# Patient Record
Sex: Male | Born: 1950 | Hispanic: No | Marital: Married | Smoking: Former smoker
Health system: Southern US, Community
[De-identification: ages and names within clinical notes are randomized; demographics above are authoritative.]

## PROBLEM LIST (undated history)

## (undated) DIAGNOSIS — G4730 Sleep apnea, unspecified: Secondary | ICD-10-CM

## (undated) DIAGNOSIS — M1990 Unspecified osteoarthritis, unspecified site: Secondary | ICD-10-CM

## (undated) DIAGNOSIS — K219 Gastro-esophageal reflux disease without esophagitis: Secondary | ICD-10-CM

## (undated) HISTORY — PX: TONSILLECTOMY: SUR1361

---

## 2017-05-26 NOTE — Progress Notes (Signed)
Please place orders in EPIC as patient is being scheduled for a pre-op appointment! Thank you! 

## 2017-05-28 NOTE — Progress Notes (Signed)
Please place orders in EPIC as patient has a pre-op appointment on 06/05/2017! Thank you! 

## 2017-06-01 ENCOUNTER — Ambulatory Visit: Payer: Self-pay | Admitting: Specialist

## 2017-06-03 NOTE — Patient Instructions (Addendum)
Clayton OsierMarvin Kline  06/03/2017   Your procedure is scheduled on: 06-12-17  [facility]  Entrance [place] Elevators to 3rd floor to Short Stay Center at 5:30 AM.    Call this number if you have problems the morning of surgery [phone]    Remember: ONLY 1 PERSON MAY GO WITH YOU TO SHORT STAY TO GET  READY MORNING OF YOUR SURGERY.  Do not eat food or drink liquids :After Midnight.     Take these medicines the morning of surgery with A SIP OF WATER: None                               You may not have any metal on your body including hair pins and              piercings  Do not wear jewelry, lotions, powders, deodorant             Men may shave face and neck.   Do not bring valuables to the hospital. [place] IS NOT             RESPONSIBLE   FOR VALUABLES.  Contacts, dentures or bridgework may not be worn into surgery.  Leave suitcase in the car. After surgery it may be brought to your room.   PLEASE BRING YOUR MASK AND TUBING FOR YOUR CPAP MACHINE               Please read over the following fact sheets you were given: _____________________________________________________________________             [facility] Pre[facility] Before surgery, you can play an important role.  Because skin is not sterile, your skin needs to be as free of germs as possible.  You can reduce the number of germs on your skin by washing with CHG (chlorahexidine gluconate) soap before surgery.  CHG is an antiseptic cleaner which kills germs and bonds with the skin to continue killing germs even after washing. Please DO NOT use if you have an allergy to CHG or antibacterial soaps.  If your skin becomes reddened/irritated stop using the CHG and inform your nurse when you arrive at Short Stay. Do not shave (including legs and underarms) for at least 48 hours prior to the first CHG shower.  You may shave your face/neck. Please follow these instructions  carefully:  1.  Shower with CHG Soap the night before surgery and the  morning of Surgery.  2.  If you choose to wash your hair, wash your hair first as usual with your  normal  shampoo.  3.  After you shampoo, rinse your hair and body thoroughly to remove the  shampoo.                           4.  Use CHG as you would any other liquid soap.  You can apply chg directly  to the skin and wash                       Gently with a scrungie or clean washcloth.  5.  Apply the CHG Soap to your body ONLY FROM THE NECK DOWN.   Do not use on face/ open  Wound or open sores. Avoid contact with eyes, ears mouth and genitals (private parts).                       Wash face,  Genitals (private parts) with your normal soap.             6.  Wash thoroughly, paying special attention to the area where your surgery  will be performed.  7.  Thoroughly rinse your body with warm water from the neck down.  8.  DO NOT shower/wash with your normal soap after using and rinsing off  the CHG Soap.                9.  Pat yourself dry with a clean towel.            10.  Wear clean pajamas.            11.  Place clean sheets on your bed the night of your first shower and do not  sleep with pets. Day of Surgery : Do not apply any lotions/deodorants the morning of surgery.  Please wear clean clothes to the hospital/surgery center.  FAILURE TO FOLLOW THESE INSTRUCTIONS MAY RESULT IN THE CANCELLATION OF YOUR SURGERY PATIENT SIGNATURE_________________________________  NURSE SIGNATURE__________________________________  ________________________________________________________________________

## 2017-06-03 NOTE — Progress Notes (Signed)
05-19-17 Surgical clearance from Caryl ComesBriejante Williams, PAC on chart.

## 2017-06-05 ENCOUNTER — Ambulatory Visit (HOSPITAL_COMMUNITY)
Admission: RE | Admit: 2017-06-05 | Discharge: 2017-06-05 | Disposition: A | Discharge status: Home / Self Care | Payer: BLUE CROSS/BLUE SHIELD | Source: Ambulatory Visit | Attending: Specialist | Admitting: Specialist

## 2017-06-05 ENCOUNTER — Encounter (HOSPITAL_COMMUNITY): Payer: Self-pay

## 2017-06-05 ENCOUNTER — Encounter (HOSPITAL_COMMUNITY)
Admission: RE | Admit: 2017-06-05 | Discharge: 2017-06-05 | Disposition: A | Discharge status: Home / Self Care | Payer: BLUE CROSS/BLUE SHIELD | Source: Ambulatory Visit | Attending: Specialist | Admitting: Specialist

## 2017-06-05 ENCOUNTER — Encounter (INDEPENDENT_AMBULATORY_CARE_PROVIDER_SITE_OTHER): Payer: Self-pay

## 2017-06-05 DIAGNOSIS — Z01818 Encounter for other preprocedural examination: Secondary | ICD-10-CM | POA: Diagnosis not present

## 2017-06-05 DIAGNOSIS — M5126 Other intervertebral disc displacement, lumbar region: Secondary | ICD-10-CM | POA: Insufficient documentation

## 2017-06-05 DIAGNOSIS — Z01812 Encounter for preprocedural laboratory examination: Secondary | ICD-10-CM | POA: Diagnosis present

## 2017-06-05 DIAGNOSIS — M4316 Spondylolisthesis, lumbar region: Secondary | ICD-10-CM | POA: Insufficient documentation

## 2017-06-05 HISTORY — DX: Unspecified osteoarthritis, unspecified site: M19.90

## 2017-06-05 HISTORY — DX: Gastro-esophageal reflux disease without esophagitis: K21.9

## 2017-06-05 HISTORY — DX: Sleep apnea, unspecified: G47.30

## 2017-06-05 LAB — BASIC METABOLIC PANEL
Anion gap: 11 (ref 5–15)
BUN: 16 mg/dL (ref 6–20)
CHLORIDE: 102 mmol/L (ref 101–111)
CO2: 25 mmol/L (ref 22–32)
CREATININE: 1.01 mg/dL (ref 0.61–1.24)
Calcium: 9.6 mg/dL (ref 8.9–10.3)
GFR calc Af Amer: >60 mL/min (ref >60)
GFR calc non Af Amer: >60 mL/min (ref >60)
Glucose, Bld: 108 mg/dL — ABNORMAL HIGH (ref 65–99)
Potassium: 4.4 mmol/L (ref 3.5–5.1)
Sodium: 138 mmol/L (ref 135–145)

## 2017-06-05 LAB — CBC
HCT: 43.1 % (ref 39.0–52.0)
Hemoglobin: 15.3 g/dL (ref 13.0–17.0)
MCH: 30.5 pg (ref 26.0–34.0)
MCHC: 35.5 g/dL (ref 30.0–36.0)
MCV: 86 fL (ref 78.0–100.0)
PLATELETS: 242 10*3/uL (ref 150–400)
RBC: 5.01 MIL/uL (ref 4.22–5.81)
RDW: 12.9 % (ref 11.5–15.5)
WBC: 5.9 10*3/uL (ref 4.0–10.5)

## 2017-06-05 LAB — SURGICAL PCR SCREEN
MRSA, PCR: NEGATIVE
Staphylococcus aureus: NEGATIVE

## 2017-06-05 NOTE — Progress Notes (Signed)
06-05-17 [facility] PAC indicated that pt needed to discuss pending surgery with  Sleep Study physician. Pt verified that he did discuss pending surgery with sleep study provider. Provider advised pt to bring mask and tubing for procedure, which patient plans to do.  Pt also wants nursing staff to be aware of recent shoulder dislocation, (2 1/2 months ago). Pt stated that surgeon is aware, and that area was treated and is healing appropriately. However, he wants nsuring staff to be made aware, so that they will be  'gentle'  with transfers.

## 2017-06-08 ENCOUNTER — Ambulatory Visit: Payer: Self-pay | Admitting: Orthopedic Surgery

## 2017-06-08 NOTE — H&P (Signed)
Clayton Kline is an 66 y.o. male.   Chief Complaint: back and leg pain HPI: The patient is a 66 year old male who presents for a Follow-up for Follow-up back. The patient is being followed for their back pain. They are now year(s) out from when symptoms began. Symptoms reported today include: pain and pain with standing. Current treatment includes: activity modification. The following medication has been used for pain control: none. The patient has reported improvement of their symptoms with: Cortisone injections. The patient indicates that they have questions or concerns today regarding surgery.  Eduardo Osier follows up and he is reporting still bilateral buttock pain, worse with activity, better with rest. He would like to discuss surgery. He is going to retire, go down to [facility].  He reports no back pain, mainly buttock pain.  Review of systems is negative for fevers, chest pain, shortness of breath, unexplained recent weight loss, loss of bowel or bladder function, burning with urination, joint swelling, rashes, weakness or numbness, difficulty with balance, easy bruising, excessive thirst or frequent urination.  Past Medical History:  Diagnosis Date  . Arthritis   . GERD (gastroesophageal reflux disease)   . Sleep apnea     Past Surgical History:  Procedure Laterality Date  . TONSILLECTOMY      No family history on file. Social History:  reports that he quit smoking about 35 years ago. He has never used smokeless tobacco. He reports that he drinks alcohol. He reports that he does not use drugs.  Allergies:  Allergies  Allergen Reactions  . Norco [Hydrocodone-Acetaminophen] Other (See Comments)    Can't concentrate very well     (Not in a hospital admission)  No results found for this or any previous visit (from the past 48 hour(s)). No results found.  Review of Systems  Constitutional: Negative.   HENT: Negative.   Eyes: Negative.   Respiratory: Negative.    Cardiovascular: Negative.   Gastrointestinal: Negative.   Genitourinary: Negative.   Musculoskeletal: Positive for back pain.  Skin: Negative.   Neurological: Positive for sensory change and focal weakness.  Psychiatric/Behavioral: Negative.     There were no vitals taken for this visit. Physical Exam  Constitutional: He is oriented to person, place, and time. He appears well-developed.  HENT:  Head: Normocephalic.  Eyes: Pupils are equal, round, and reactive to light.  Neck: Normal range of motion.  Cardiovascular: Normal rate.   Respiratory: Effort normal.  GI: Soft.  Musculoskeletal:  On exam, healthy, mild discomfort. Straight leg raises buttock pain bilaterally. Trace EHL bilaterally. Pain relieved with forward flexion. Radicular pain with extension.  Neurological: He is alert and oriented to person, place, and time.  Skin: Skin is warm and dry.    AP, lateral and flexion and extension demonstrates minimal offset at 4-5, but no instability in flexion and extension.  MRI demonstrates multifactorial severe stenosis at 4-5, slight offset is noted there.  Some hypertrophic ligamentum flavum is noted. The stenosis is predominantly beneath the inferior aspect of the neural arch at 4. There is some foraminal narrowing at L4 foramen, more on the right than on the left.  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 Again, no instability in flexion and extension. He has no motion at 4-5.  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 No instability in flexion and extension. No change from supine to the upright position. Myotomal weakness and dermal dysesthesias, also has facet arthropathy  at 3-4 and at 5-1.  1. We had an extensive discussion concerning current pathology, relevant anatomy and treatment options. Options living with his symptoms as they are now, avoiding extension and favoring flexion. We discussed  this. 2. I would get epidurals as well. 3. We discussed surgical intervention and decompression at 4-5. Given he is not moving and he does not have back pain, we could do a limited decompression without concomitant fusion, though I did indicate he may require a fusion in the future. He understands. 4. Bilateral decompression would be appropriate as well. There may be a pars defect at L5 on the left. He has facet arthropathy so any instrumentation at 4-5 would likely aggravate the facets at 3-4 and at 5-1. We will have him scheduled for that procedure. He is not allergic to penicillin. No history of DVT or MRSA. He is on a blood pressure medicine. He again predominately has it down in the left leg with standing and relieved with sitting, symptoms for four years.  Plan bilateral microlumbar decompression L4-5, possible central laminectomy  Dorothy SparkBISSELL, Jamilah Jean M., PA-C for Dr. Shelle IronBeane 06/08/2017, 12:14 PM

## 2017-06-11 ENCOUNTER — Encounter (HOSPITAL_COMMUNITY): Payer: Self-pay | Admitting: Anesthesiology

## 2017-06-11 NOTE — Anesthesia Preprocedure Evaluation (Addendum)
Anesthesia Evaluation  Patient identified by MRN, date of birth, ID band Patient awake    Reviewed: Allergy & Precautions, NPO status , Patient's Chart, lab work & pertinent test results  Airway Mallampati: I       Dental no notable dental hx. (+) Teeth Intact   Pulmonary former smoker,    Pulmonary exam normal breath sounds clear to auscultation       Cardiovascular Normal cardiovascular exam Rhythm:Regular Rate:Normal     Neuro/Psych negative neurological ROS  negative psych ROS   GI/Hepatic   Endo/Other  negative endocrine ROS  Renal/GU negative Renal ROS  negative genitourinary   Musculoskeletal   Abdominal Normal abdominal exam  (+)   Peds  Hematology negative hematology ROS (+)   Anesthesia Other Findings   Reproductive/Obstetrics                            Anesthesia Physical Anesthesia Plan  ASA: II  Anesthesia Plan: General   Post-op Pain Management:    Induction: Intravenous  PONV Risk Score and Plan: 3 and Ondansetron, Dexamethasone, Propofol and Midazolam  Airway Management Planned: Oral ETT  Additional Equipment:   Intra-op Plan:   Post-operative Plan: Extubation in OR  Informed Consent: I have reviewed the patients History and Physical, chart, labs and discussed the procedure including the risks, benefits and alternatives for the proposed anesthesia with the patient or authorized representative who has indicated his/her understanding and acceptance.   Dental advisory given  Plan Discussed with: CRNA and Surgeon  Anesthesia Plan Comments:        Anesthesia Quick Evaluation

## 2017-06-12 ENCOUNTER — Ambulatory Visit (HOSPITAL_COMMUNITY): Payer: BLUE CROSS/BLUE SHIELD

## 2017-06-12 ENCOUNTER — Encounter (HOSPITAL_COMMUNITY): Payer: Self-pay | Admitting: *Deleted

## 2017-06-12 ENCOUNTER — Ambulatory Visit (HOSPITAL_COMMUNITY): Payer: BLUE CROSS/BLUE SHIELD | Admitting: Certified Registered Nurse Anesthetist

## 2017-06-12 ENCOUNTER — Ambulatory Visit (HOSPITAL_COMMUNITY)
Admission: RE | Admit: 2017-06-12 | Discharge: 2017-06-13 | Disposition: A | Discharge status: Home / Self Care | Payer: BLUE CROSS/BLUE SHIELD | Source: Ambulatory Visit | Attending: Specialist | Admitting: Specialist

## 2017-06-12 ENCOUNTER — Encounter (HOSPITAL_COMMUNITY)
Admission: RE | Disposition: A | Discharge status: Home / Self Care | Payer: Self-pay | Source: Ambulatory Visit | Attending: Specialist

## 2017-06-12 DIAGNOSIS — M48061 Spinal stenosis, lumbar region without neurogenic claudication: Secondary | ICD-10-CM | POA: Diagnosis present

## 2017-06-12 DIAGNOSIS — Z885 Allergy status to narcotic agent status: Secondary | ICD-10-CM | POA: Insufficient documentation

## 2017-06-12 DIAGNOSIS — Z87891 Personal history of nicotine dependence: Secondary | ICD-10-CM | POA: Insufficient documentation

## 2017-06-12 DIAGNOSIS — G4730 Sleep apnea, unspecified: Secondary | ICD-10-CM | POA: Diagnosis not present

## 2017-06-12 DIAGNOSIS — M5116 Intervertebral disc disorders with radiculopathy, lumbar region: Secondary | ICD-10-CM | POA: Insufficient documentation

## 2017-06-12 DIAGNOSIS — M48062 Spinal stenosis, lumbar region with neurogenic claudication: Secondary | ICD-10-CM | POA: Diagnosis not present

## 2017-06-12 DIAGNOSIS — M1990 Unspecified osteoarthritis, unspecified site: Secondary | ICD-10-CM | POA: Diagnosis not present

## 2017-06-12 DIAGNOSIS — K219 Gastro-esophageal reflux disease without esophagitis: Secondary | ICD-10-CM | POA: Diagnosis not present

## 2017-06-12 DIAGNOSIS — M160 Bilateral primary osteoarthritis of hip: Secondary | ICD-10-CM | POA: Diagnosis not present

## 2017-06-12 DIAGNOSIS — Z419 Encounter for procedure for purposes other than remedying health state, unspecified: Secondary | ICD-10-CM

## 2017-06-12 HISTORY — PX: LUMBAR LAMINECTOMY/DECOMPRESSION MICRODISCECTOMY: SHX5026

## 2017-06-12 SURGERY — LUMBAR LAMINECTOMY/DECOMPRESSION MICRODISCECTOMY
Anesthesia: General | Site: Back | Laterality: Bilateral

## 2017-06-12 MED ORDER — DEXAMETHASONE SODIUM PHOSPHATE 10 MG/ML IJ SOLN
INTRAMUSCULAR | Status: DC | PRN
  Administered 2017-06-12: 10 mg via INTRAVENOUS

## 2017-06-12 MED ORDER — FENTANYL CITRATE (PF) 100 MCG/2ML IJ SOLN
INTRAMUSCULAR | Status: DC | PRN
  Administered 2017-06-12 (×3): 50 ug via INTRAVENOUS

## 2017-06-12 MED ORDER — GELATIN ABSORBABLE MT POWD
OROMUCOSAL | Status: DC | PRN
  Administered 2017-06-12: 5 mL via TOPICAL

## 2017-06-12 MED ORDER — SUCCINYLCHOLINE CHLORIDE 200 MG/10ML IV SOSY
PREFILLED_SYRINGE | INTRAVENOUS | Status: AC
  Filled 2017-06-12: qty 10

## 2017-06-12 MED ORDER — DOCUSATE SODIUM 100 MG PO CAPS: 100.0000 mg | ORAL_CAPSULE | Freq: Two times a day (BID) | ORAL | 1 refills | Status: AC | PRN

## 2017-06-12 MED ORDER — PHENOL 1.4 % MT LIQD
1.0000 | OROMUCOSAL | Status: DC | PRN
  Filled 2017-06-12: qty 177

## 2017-06-12 MED ORDER — PROPOFOL 10 MG/ML IV BOLUS
INTRAVENOUS | Status: AC
  Filled 2017-06-12: qty 20

## 2017-06-12 MED ORDER — CHLORHEXIDINE GLUCONATE 4 % EX LIQD: 60.0000 mL | Freq: Once | CUTANEOUS | Status: DC

## 2017-06-12 MED ORDER — CEFAZOLIN SODIUM-DEXTROSE 2-4 GM/100ML-% IV SOLN
INTRAVENOUS | Status: AC
  Filled 2017-06-12: qty 100

## 2017-06-12 MED ORDER — MIDAZOLAM HCL 5 MG/5ML IJ SOLN
INTRAMUSCULAR | Status: DC | PRN
  Administered 2017-06-12: 2 mg via INTRAVENOUS

## 2017-06-12 MED ORDER — BUPIVACAINE-EPINEPHRINE (PF) 0.5% -1:200000 IJ SOLN
INTRAMUSCULAR | Status: DC | PRN
  Administered 2017-06-12: 16 mL

## 2017-06-12 MED ORDER — FENTANYL CITRATE (PF) 250 MCG/5ML IJ SOLN
INTRAMUSCULAR | Status: AC
  Filled 2017-06-12: qty 5

## 2017-06-12 MED ORDER — ACETAMINOPHEN 10 MG/ML IV SOLN
INTRAVENOUS | Status: AC
  Filled 2017-06-12: qty 100

## 2017-06-12 MED ORDER — DOCUSATE SODIUM 100 MG PO CAPS
100.0000 mg | ORAL_CAPSULE | Freq: Two times a day (BID) | ORAL | Status: DC
  Administered 2017-06-12 – 2017-06-13 (×2): 100 mg via ORAL
  Filled 2017-06-12 (×2): qty 1

## 2017-06-12 MED ORDER — DEXAMETHASONE SODIUM PHOSPHATE 10 MG/ML IJ SOLN
INTRAMUSCULAR | Status: AC
  Filled 2017-06-12: qty 1

## 2017-06-12 MED ORDER — CEFAZOLIN SODIUM-DEXTROSE 2-4 GM/100ML-% IV SOLN
2.0000 g | Freq: Three times a day (TID) | INTRAVENOUS | Status: AC
  Administered 2017-06-12 – 2017-06-13 (×2): 2 g via INTRAVENOUS
  Filled 2017-06-12 (×2): qty 100

## 2017-06-12 MED ORDER — METHOCARBAMOL 1000 MG/10ML IJ SOLN
500.0000 mg | Freq: Four times a day (QID) | INTRAVENOUS | Status: DC | PRN
  Administered 2017-06-12: 500 mg via INTRAVENOUS
  Filled 2017-06-12: qty 550

## 2017-06-12 MED ORDER — SODIUM CHLORIDE 0.9 % IV SOLN
INTRAVENOUS | Status: AC
  Filled 2017-06-12: qty 500000

## 2017-06-12 MED ORDER — PHENYLEPHRINE HCL 10 MG/ML IJ SOLN
INTRAMUSCULAR | Status: AC
  Filled 2017-06-12: qty 1

## 2017-06-12 MED ORDER — SUCCINYLCHOLINE CHLORIDE 200 MG/10ML IV SOSY
PREFILLED_SYRINGE | INTRAVENOUS | Status: DC | PRN
  Administered 2017-06-12: 120 mg via INTRAVENOUS

## 2017-06-12 MED ORDER — MEPERIDINE HCL 50 MG/ML IJ SOLN: 6.2500 mg | INTRAMUSCULAR | Status: DC | PRN

## 2017-06-12 MED ORDER — PHENYLEPHRINE HCL 10 MG/ML IJ SOLN
INTRAVENOUS | Status: DC | PRN
  Administered 2017-06-12: 25 ug/min via INTRAVENOUS

## 2017-06-12 MED ORDER — ACETAMINOPHEN 325 MG PO TABS: 650.0000 mg | ORAL_TABLET | ORAL | Status: DC | PRN

## 2017-06-12 MED ORDER — OXYCODONE-ACETAMINOPHEN 5-325 MG PO TABS: 1.0000 | ORAL_TABLET | ORAL | 0 refills | Status: AC | PRN

## 2017-06-12 MED ORDER — ROCURONIUM BROMIDE 50 MG/5ML IV SOSY
PREFILLED_SYRINGE | INTRAVENOUS | Status: AC
  Filled 2017-06-12: qty 5

## 2017-06-12 MED ORDER — CEFAZOLIN SODIUM-DEXTROSE 2-4 GM/100ML-% IV SOLN
2.0000 g | INTRAVENOUS | Status: AC
  Administered 2017-06-12: 2 g via INTRAVENOUS

## 2017-06-12 MED ORDER — BUPIVACAINE-EPINEPHRINE (PF) 0.5% -1:200000 IJ SOLN
INTRAMUSCULAR | Status: AC
  Filled 2017-06-12: qty 30

## 2017-06-12 MED ORDER — PROMETHAZINE HCL 25 MG/ML IJ SOLN: 6.2500 mg | INTRAMUSCULAR | Status: DC | PRN

## 2017-06-12 MED ORDER — ACETAMINOPHEN 650 MG RE SUPP
650.0000 mg | RECTAL | Status: DC | PRN
Start: 2017-06-12 — End: 2017-06-13

## 2017-06-12 MED ORDER — ACETAMINOPHEN 10 MG/ML IV SOLN
1000.0000 mg | Freq: Four times a day (QID) | INTRAVENOUS | Status: DC
  Administered 2017-06-12: 1000 mg via INTRAVENOUS

## 2017-06-12 MED ORDER — OXYCODONE HCL 5 MG PO TABS
5.0000 mg | ORAL_TABLET | ORAL | Status: DC | PRN
  Administered 2017-06-12 – 2017-06-13 (×4): 10 mg via ORAL
  Filled 2017-06-12 (×4): qty 2

## 2017-06-12 MED ORDER — SODIUM CHLORIDE 0.9 % IV SOLN
INTRAVENOUS | Status: DC | PRN
  Administered 2017-06-12: 500 mL

## 2017-06-12 MED ORDER — POLYETHYLENE GLYCOL 3350 17 G PO PACK: 17.0000 g | PACK | Freq: Every day | ORAL | 0 refills | Status: AC

## 2017-06-12 MED ORDER — ONDANSETRON HCL 4 MG/2ML IJ SOLN: 4.0000 mg | Freq: Four times a day (QID) | INTRAMUSCULAR | Status: DC | PRN

## 2017-06-12 MED ORDER — THROMBIN 5000 UNITS EX SOLR
OROMUCOSAL | Status: DC | PRN
  Administered 2017-06-12: 5 mL via TOPICAL

## 2017-06-12 MED ORDER — FAMOTIDINE 20 MG PO TABS
10.0000 mg | ORAL_TABLET | Freq: Two times a day (BID) | ORAL | Status: DC
  Administered 2017-06-12 – 2017-06-13 (×3): 10 mg via ORAL
  Filled 2017-06-12 (×3): qty 1

## 2017-06-12 MED ORDER — MAGNESIUM CITRATE PO SOLN: 1.0000 | Freq: Once | ORAL | Status: DC | PRN

## 2017-06-12 MED ORDER — ONDANSETRON HCL 4 MG/2ML IJ SOLN
INTRAMUSCULAR | Status: DC | PRN
  Administered 2017-06-12: 4 mg via INTRAVENOUS

## 2017-06-12 MED ORDER — ROCURONIUM BROMIDE 50 MG/5ML IV SOSY
PREFILLED_SYRINGE | INTRAVENOUS | Status: DC | PRN
  Administered 2017-06-12 (×2): 5 mg via INTRAVENOUS
  Administered 2017-06-12: 40 mg via INTRAVENOUS
  Administered 2017-06-12 (×2): 5 mg via INTRAVENOUS

## 2017-06-12 MED ORDER — LIDOCAINE 2% (20 MG/ML) 5 ML SYRINGE
INTRAMUSCULAR | Status: DC | PRN
  Administered 2017-06-12: 100 mg via INTRAVENOUS

## 2017-06-12 MED ORDER — FENTANYL CITRATE (PF) 100 MCG/2ML IJ SOLN: 25.0000 ug | INTRAMUSCULAR | Status: DC | PRN

## 2017-06-12 MED ORDER — PHENYLEPHRINE HCL 10 MG/ML IJ SOLN
INTRAMUSCULAR | Status: DC | PRN
  Administered 2017-06-12: 80 ug via INTRAVENOUS
  Administered 2017-06-12: 40 ug via INTRAVENOUS
  Administered 2017-06-12: 120 ug via INTRAVENOUS
  Administered 2017-06-12 (×2): 80 ug via INTRAVENOUS

## 2017-06-12 MED ORDER — THROMBIN 5000 UNITS EX SOLR
CUTANEOUS | Status: AC
  Filled 2017-06-12: qty 10000

## 2017-06-12 MED ORDER — EPHEDRINE SULFATE 50 MG/ML IJ SOLN
INTRAMUSCULAR | Status: DC | PRN
  Administered 2017-06-12: 10 mg via INTRAVENOUS

## 2017-06-12 MED ORDER — METHOCARBAMOL 500 MG PO TABS
500.0000 mg | ORAL_TABLET | Freq: Four times a day (QID) | ORAL | Status: DC | PRN
  Administered 2017-06-13: 02:00:00 500 mg via ORAL
  Filled 2017-06-12: qty 1

## 2017-06-12 MED ORDER — LACTATED RINGERS IV SOLN
INTRAVENOUS | Status: DC
  Administered 2017-06-12 (×2): via INTRAVENOUS

## 2017-06-12 MED ORDER — MENTHOL 3 MG MT LOZG: 1.0000 | LOZENGE | OROMUCOSAL | Status: DC | PRN

## 2017-06-12 MED ORDER — ONDANSETRON HCL 4 MG/2ML IJ SOLN
INTRAMUSCULAR | Status: AC
  Filled 2017-06-12: qty 2

## 2017-06-12 MED ORDER — LIDOCAINE 2% (20 MG/ML) 5 ML SYRINGE
INTRAMUSCULAR | Status: AC
  Filled 2017-06-12: qty 5

## 2017-06-12 MED ORDER — PROPOFOL 10 MG/ML IV BOLUS
INTRAVENOUS | Status: DC | PRN
  Administered 2017-06-12: 200 mg via INTRAVENOUS

## 2017-06-12 MED ORDER — SUGAMMADEX SODIUM 200 MG/2ML IV SOLN
INTRAVENOUS | Status: DC | PRN
  Administered 2017-06-12: 200 mg via INTRAVENOUS

## 2017-06-12 MED ORDER — FENTANYL CITRATE (PF) 100 MCG/2ML IJ SOLN
INTRAMUSCULAR | Status: AC
  Filled 2017-06-12: qty 2

## 2017-06-12 MED ORDER — LORATADINE 10 MG PO TABS
10.0000 mg | ORAL_TABLET | Freq: Every day | ORAL | Status: DC
  Administered 2017-06-12 – 2017-06-13 (×2): 10 mg via ORAL
  Filled 2017-06-12 (×2): qty 1

## 2017-06-12 MED ORDER — RISAQUAD PO CAPS
1.0000 | ORAL_CAPSULE | Freq: Every day | ORAL | Status: DC
  Administered 2017-06-12 – 2017-06-13 (×2): 1 via ORAL
  Filled 2017-06-12 (×2): qty 1

## 2017-06-12 MED ORDER — BISACODYL 5 MG PO TBEC: 5.0000 mg | DELAYED_RELEASE_TABLET | Freq: Every day | ORAL | Status: DC | PRN

## 2017-06-12 MED ORDER — KETOROLAC TROMETHAMINE 30 MG/ML IJ SOLN: 30.0000 mg | Freq: Once | INTRAMUSCULAR | Status: DC | PRN

## 2017-06-12 MED ORDER — POLYETHYLENE GLYCOL 3350 17 G PO PACK: 17.0000 g | PACK | Freq: Every day | ORAL | Status: DC | PRN

## 2017-06-12 MED ORDER — MIDAZOLAM HCL 2 MG/2ML IJ SOLN
INTRAMUSCULAR | Status: AC
  Filled 2017-06-12: qty 2

## 2017-06-12 MED ORDER — ALUM & MAG HYDROXIDE-SIMETH 200-200-20 MG/5ML PO SUSP: 30.0000 mL | Freq: Four times a day (QID) | ORAL | Status: DC | PRN

## 2017-06-12 MED ORDER — KCL IN DEXTROSE-NACL 20-5-0.45 MEQ/L-%-% IV SOLN
INTRAVENOUS | Status: AC
  Administered 2017-06-12: 15:00:00 via INTRAVENOUS
  Filled 2017-06-12 (×2): qty 1000

## 2017-06-12 MED ORDER — DIPHENHYDRAMINE HCL 25 MG PO CAPS: 50.0000 mg | ORAL_CAPSULE | Freq: Four times a day (QID) | ORAL | Status: DC | PRN

## 2017-06-12 MED ORDER — ONDANSETRON HCL 4 MG PO TABS: 4.0000 mg | ORAL_TABLET | Freq: Four times a day (QID) | ORAL | Status: DC | PRN

## 2017-06-12 SURGICAL SUPPLY — 46 items
BAG ZIPLOCK 12X15 (MISCELLANEOUS) ×3 IMPLANT
CLOSURE WOUND 1/2 X4 (GAUZE/BANDAGES/DRESSINGS) ×1
CLOTH 2% CHLOROHEXIDINE 3PK (PERSONAL CARE ITEMS) ×3 IMPLANT
COVER SURGICAL LIGHT HANDLE (MISCELLANEOUS) ×3 IMPLANT
DRAPE MICROSCOPE LEICA (MISCELLANEOUS) ×3 IMPLANT
DRAPE SHEET LG 3/4 BI-LAMINATE (DRAPES) ×3 IMPLANT
DRAPE SURG 17X11 SM STRL (DRAPES) ×3 IMPLANT
DRAPE UTILITY XL STRL (DRAPES) ×3 IMPLANT
DRSG AQUACEL AG ADV 3.5X 4 (GAUZE/BANDAGES/DRESSINGS) ×3 IMPLANT
DRSG AQUACEL AG ADV 3.5X 6 (GAUZE/BANDAGES/DRESSINGS) IMPLANT
DRSG TEGADERM 2-3/8X2-3/4 SM (GAUZE/BANDAGES/DRESSINGS) ×3 IMPLANT
DURAPREP 26ML APPLICATOR (WOUND CARE) ×3 IMPLANT
ELECT BLADE TIP CTD 4 INCH (ELECTRODE) ×3 IMPLANT
ELECT REM PT RETURN 15FT ADLT (MISCELLANEOUS) ×3 IMPLANT
GLOVE BIOGEL PI IND STRL 7.0 (GLOVE) ×1 IMPLANT
GLOVE BIOGEL PI IND STRL 7.5 (GLOVE) ×3 IMPLANT
GLOVE BIOGEL PI INDICATOR 7.0 (GLOVE) ×2
GLOVE BIOGEL PI INDICATOR 7.5 (GLOVE) ×6
GLOVE SURG SS PI 7.0 STRL IVOR (GLOVE) ×6 IMPLANT
GLOVE SURG SS PI 7.5 STRL IVOR (GLOVE) ×3 IMPLANT
GLOVE SURG SS PI 8.0 STRL IVOR (GLOVE) ×6 IMPLANT
GOWN STRL REUS W/TWL XL LVL3 (GOWN DISPOSABLE) ×9 IMPLANT
IV CATH 14GX2 1/4 (CATHETERS) ×3 IMPLANT
KIT BASIN OR (CUSTOM PROCEDURE TRAY) ×3 IMPLANT
KIT POSITIONING SURG ANDREWS (MISCELLANEOUS) ×3 IMPLANT
MANIFOLD NEPTUNE II (INSTRUMENTS) ×3 IMPLANT
NEEDLE SPNL 18GX3.5 QUINCKE PK (NEEDLE) ×6 IMPLANT
PACK LAMINECTOMY ORTHO (CUSTOM PROCEDURE TRAY) ×3 IMPLANT
PATTIES SURGICAL .5 X.5 (GAUZE/BANDAGES/DRESSINGS) IMPLANT
PATTIES SURGICAL .75X.75 (GAUZE/BANDAGES/DRESSINGS) ×3 IMPLANT
RUBBERBAND STERILE (MISCELLANEOUS) ×6 IMPLANT
SPONGE LAP 4X18 X RAY DECT (DISPOSABLE) ×3 IMPLANT
SPONGE SURGIFOAM ABS GEL 100 (HEMOSTASIS) ×3 IMPLANT
STAPLER VISISTAT (STAPLE) IMPLANT
STRIP CLOSURE SKIN 1/2X4 (GAUZE/BANDAGES/DRESSINGS) ×2 IMPLANT
SUT NURALON 4 0 TR CR/8 (SUTURE) IMPLANT
SUT PROLENE 3 0 PS 2 (SUTURE) IMPLANT
SUT VIC AB 1 CT1 27 (SUTURE)
SUT VIC AB 1 CT1 27XBRD ANTBC (SUTURE) IMPLANT
SUT VIC AB 1-0 CT2 27 (SUTURE) IMPLANT
SUT VIC AB 2-0 CT1 27 (SUTURE)
SUT VIC AB 2-0 CT1 TAPERPNT 27 (SUTURE) IMPLANT
SUT VIC AB 2-0 CT2 27 (SUTURE) IMPLANT
SYR 3ML LL SCALE MARK (SYRINGE) IMPLANT
TOWEL OR 17X26 10 PK STRL BLUE (TOWEL DISPOSABLE) ×3 IMPLANT
YANKAUER SUCT BULB TIP NO VENT (SUCTIONS) ×3 IMPLANT

## 2017-06-12 NOTE — Evaluation (Signed)
Occupational Therapy Evaluation and Discharge Patient Details Name: Clayton Kline MRN: [id number] DOB: Oct 27, 1951 Today's Date: 06/12/2017    History of Present Illness [age] yoo male admitted for Bilateral microlumbar decompression L4-L5 and central laminectomy on 06/12/17.   Clinical Impression   Pt reports he was independent with ADL PTA. Currently pt overall supervision for ADL and functional mobility. All back, safety, and ADL education completed with pt. Pt planning to d/c home with family assist. No further acute OT needs identified; signing off at this time. Please re-consult if needs change. Thank you for this referral.    Follow Up Recommendations  No OT follow up;Supervision - Intermittent    Equipment Recommendations  None recommended by OT    Recommendations for Other Services       Precautions / Restrictions Precautions Precautions: Back Precaution Booklet Issued: No Precaution Comments: Pt able to recall 3/3 precautions and maintain during functional activities Restrictions Weight Bearing Restrictions: No      Mobility Bed Mobility Overal bed mobility: Needs Assistance Bed Mobility: Rolling;Sidelying to Sit;Sit to Sidelying Rolling: Supervision Sidelying to sit: Supervision     Sit to sidelying: Supervision General bed mobility comments: Good technique, supervision for safety. HOB flat with light use of bed rail for rolling  Transfers Overall transfer level: Needs assistance Equipment used: Rolling walker (2 wheeled) Transfers: Sit to/from Stand Sit to Stand: Supervision         General transfer comment: for safety, good hand placement and technique    Balance Overall balance assessment: No apparent balance deficits (not formally assessed)                                         ADL either performed or assessed with clinical judgement   ADL Overall ADL's : Needs assistance/impaired Eating/Feeding: Independent;Sitting    Grooming: Supervision/safety;Standing Grooming Details (indicate cue type and reason): Educated on use of 2 cups for oral care Upper Body Bathing: Set up;Sitting   Lower Body Bathing: Supervison/ safety;Sit to/from stand   Upper Body Dressing : Set up;Sitting   Lower Body Dressing: Supervision/safety;Sit to/from stand Lower Body Dressing Details (indicate cue type and reason): Pt able to cross foot over opposite knee; educated on compensatory strategies for LB ADL. Toilet Transfer: Supervision/safety;Ambulation;Regular Toilet;RW   Toileting- Clothing Manipulation and Hygiene: Supervision/safety Toileting - Clothing Manipulation Details (indicate cue type and reason): Educated on proper technique for peri care without twisting and use of wet wipes     Functional mobility during ADLs: Supervision/safety;Rolling walker General ADL Comments: Educated pt on maintaining back precautions during functional activities, keeping frequently used items at counter top height, frequent mobility thorughout the day upon return home, and log roll technique for bed mobility.     Vision         Perception     Praxis      Pertinent Vitals/Pain Pain Assessment: 0-10 Pain Score: 2  Pain Location: back Pain Descriptors / Indicators: Discomfort;Burning Pain Intervention(s): Monitored during session;Patient requesting pain meds-RN notified     Hand Dominance     Extremity/Trunk Assessment Upper Extremity Assessment Upper Extremity Assessment: LUE deficits/detail;Overall WFL for tasks assessed LUE Deficits / Details: reports recovering from dislocated shoulder with limitations   Lower Extremity Assessment Lower Extremity Assessment: Defer to PT evaluation   Cervical / Trunk Assessment Cervical / Trunk Assessment: Other exceptions Cervical / Trunk Exceptions: s/p spinal sx  Communication Communication Communication: No difficulties   Cognition Arousal/Alertness: Awake/alert Behavior  During Therapy: WFL for tasks assessed/performed Overall Cognitive Status: Within Functional Limits for tasks assessed                                     General Comments       Exercises     Shoulder Instructions      Home Living Family/patient expects to be discharged to:: Private residence Living Arrangements: Spouse/significant other Available Help at Discharge: Family Type of Home: House Home Access: Stairs to enter [employer] of Steps: 2 Entrance Stairs-Rails: None Home Layout: Two level;Able to live on main level with bedroom/bathroom     Bathroom Shower/Tub: Producer, television/film/video: Handicapped height     Home Equipment: None          Prior Functioning/Environment Level of Independence: Independent                 OT Problem List:        OT Treatment/Interventions:      OT Goals(Current goals can be found in the care plan section) Acute Rehab OT Goals Patient Stated Goal: go home OT Goal Formulation: All assessment and education complete, DC therapy  OT Frequency:     Barriers to D/C:            Co-evaluation              AM-PAC PT "6 Clicks" Daily Activity     Outcome Measure Help from another person eating meals?: None Help from another person taking care of personal grooming?: None Help from another person toileting, which includes using toliet, bedpan, or urinal?: A Little Help from another person bathing (including washing, rinsing, drying)?: A Little Help from another person to put on and taking off regular upper body clothing?: None Help from another person to put on and taking off regular lower body clothing?: A Little 6 Click Score: 21   End of Session Equipment Utilized During Treatment: Rolling walker Nurse Communication: Mobility status;Patient requests pain meds  Activity Tolerance: Patient tolerated treatment well Patient left: in bed;with call bell/phone within reach  OT Visit  Diagnosis: Pain Pain - part of body:  (back)                Time: 1510-1526 OT Time Calculation (min): 16 min Charges:  OT General Charges $OT Visit: 1 Procedure OT Evaluation $OT Eval Moderate Complexity: 1 Procedure G-Codes: OT G-codes **NOT FOR INPATIENT CLASS** Functional Assessment Tool Used: Clinical judgement Functional Limitation: Self care Self Care Current Status (Z6109): At least 1 percent but less than 20 percent impaired, limited or restricted Self Care Goal Status (U0454): At least 1 percent but less than 20 percent impaired, limited or restricted Self Care Discharge Status [phone]): At least 1 percent but less than 20 percent impaired, limited or restricted   Fredric Mare A. Brett Albino, M.S., OTR/L Pager: [phone]  Gaye Alken 06/12/2017, 3:32 PM

## 2017-06-12 NOTE — Brief Op Note (Signed)
06/12/2017  9:28 AM  PATIENT:  Clayton OsierMarvin Kline  66 y.o. male  PRE-OPERATIVE DIAGNOSIS:  Stenosis L4-L5  POST-OPERATIVE DIAGNOSIS:  Stenosis L4-L5  PROCEDURE:  Procedure(s): Bilateral microlumbar decompression L4-L5 and central laminectomy (Bilateral)  SURGEON:  Surgeon(s) and Role:    Jene Every* Juelz Whittenberg, MD - Primary  PHYSICIAN ASSISTANT:   ASSISTANTS: Bissell   ANESTHESIA:   general  EBL:  Total I/O In: 1000 [I.V.:1000] Out: 50 [Blood:50]  BLOOD ADMINISTERED:none  DRAINS: none   LOCAL MEDICATIONS USED:  MARCAINE     SPECIMEN:  No Specimen  DISPOSITION OF SPECIMEN:  N/A  COUNTS:  YES  TOURNIQUET:  * No tourniquets in log *  DICTATION: . [id number]  PLAN OF CARE: Admit for overnight observation  PATIENT DISPOSITION:  PACU - hemodynamically stable.   Delay start of Pharmacological VTE agent (>24hrs) due to surgical blood loss or risk of bleeding: yes

## 2017-06-12 NOTE — Progress Notes (Signed)
Pt placed on CPAP with home settings and mask.  Pt tolerating well.

## 2017-06-12 NOTE — Evaluation (Signed)
Physical Therapy Evaluation Patient Details Name: Clayton Kline MRN: [id number] DOB: 1951-01-06 Today's Date: 06/12/2017   History of Present Illness  68 yoo male admitted for Bilateral microlumbar decompression L4-L5 and central laminectomy on 06/12/17.  Clinical Impression  The patient ambulated x 50'. Some dizziness and reports feeling very  sleepy. Assisted  Back into bed. Pt admitted with above diagnosis. Pt currently with functional limitations due to the deficits listed below (see PT Problem List).  Pt will benefit from skilled PT to increase their independence and safety with mobility to allow discharge to the venue listed below.       Follow Up Recommendations No PT follow up    Equipment Recommendations  None recommended by PT    Recommendations for Other Services       Precautions / Restrictions Precautions Precautions: Back Precaution Comments: verbally reviewed 3/3 precautions      Mobility  Bed Mobility Overal bed mobility: Needs Assistance Bed Mobility: Rolling;Sidelying to Sit;Sit to Sidelying Rolling: Min guard Sidelying to sit: Min guard     Sit to sidelying: Min guard General bed mobility comments: cues for technique and back precautions  Transfers Overall transfer level: Needs assistance Equipment used: Rolling walker (2 wheeled) Transfers: Sit to/from Stand Sit to Stand: Min assist         General transfer comment: cues for technique  Ambulation/Gait Ambulation/Gait assistance: Min assist Ambulation Distance (Feet): 50 Feet Assistive device: Rolling walker (2 wheeled) Gait Pattern/deviations: Step-through pattern     General Gait Details: patient feeling a little  dizzy so limited ambulation.   Stairs            Wheelchair Mobility    Modified Rankin (Stroke Patients Only)       Balance                                             Pertinent Vitals/Pain Pain Assessment: 0-10 Pain Score: 2  Pain  Location: back Pain Descriptors / Indicators: Sore Pain Intervention(s): Premedicated before session;Monitored during session    Home Living Family/patient expects to be discharged to:: Private residence Living Arrangements: Spouse/significant other Available Help at Discharge: Family Type of Home: House Home Access: Stairs to enter Entrance Stairs-Rails: None Entrance Stairs-Number of Steps: 2 Home Layout: Two level;Able to live on main level with bedroom/bathroom Home Equipment: None      Prior Function Level of Independence: Independent               Hand Dominance        Extremity/Trunk Assessment   Upper Extremity Assessment Upper Extremity Assessment: LUE deficits/detail;Defer to OT evaluation LUE Deficits / Details: reports recovering from dislocated shoulder with limitations    Lower Extremity Assessment Lower Extremity Assessment: Overall WFL for tasks assessed    Cervical / Trunk Assessment Cervical / Trunk Assessment: Normal  Communication   Communication: No difficulties  Cognition Arousal/Alertness: Awake/alert Behavior During Therapy: WFL for tasks assessed/performed Overall Cognitive Status: Within Functional Limits for tasks assessed                                        General Comments      Exercises     Assessment/Plan    PT Assessment Patient needs continued PT services  PT  Problem List Decreased activity tolerance;Decreased mobility;Decreased safety awareness;Decreased knowledge of precautions;Pain       PT Treatment Interventions DME instruction;Gait training;Stair training;Functional mobility training;Therapeutic activities;Patient/family education    PT Goals (Current goals can be found in the Care Plan section)  Acute Rehab PT Goals Patient Stated Goal[place] PT Goal Formulation: With patient Time For Goal Achievement: 06/14/17 Potential to Achieve Goals: Good    Frequency Min 5X/week    Barriers to discharge        Co-evaluation               AM-PAC PT "6 Clicks" Daily Activity  Outcome Measure Difficulty turning over in bed (including adjusting bedclothes, sheets and blankets)?: Total Difficulty moving from lying on back to sitting on the side of the bed? : Total Difficulty sitting down on and standing up from a chair with arms (e.g., wheelchair, bedside commode, etc,.)?: Total Help needed moving to and from a bed to chair (including a wheelchair)?: Total Help needed walking in hospital room?: Total Help needed climbing 3-5 steps with a railing? : Total 6 Click Score: 6    End of Session   Activity Tolerance: Patient tolerated treatment well Patient left: in bed;with call bell/phone within reach Nurse Communication: Mobility status PT Visit Diagnosis: Difficulty in walking, not elsewhere classified (R26.2);Pain    Time: 1303-1330 PT Time Calculation (min) (ACUTE ONLY): 27 min   Charges:   PT Evaluation $PT Eval Low Complexity: 1 Procedure PT Treatments $Gait Training: 8-22 mins   PT G Codes:   PT G-Codes **NOT FOR INPATIENT CLASS** Functional Assessment Tool Used: Clinical judgement Functional Limitation: Mobility: Walking and moving around Mobility: Walking and Moving Around Current Status (R6045(G8978): At least 20 percent but less than 40 percent impaired, limited or restricted Mobility: Walking and Moving Around Goal Status [phone](G8979): At least 1 percent but less than 20 percent impaired, limited or restricted    [facility] Mathisen PT [phone]   Rada HayHill, Kristel Durkee Elizabeth 06/12/2017, 1:40 PM

## 2017-06-12 NOTE — Discharge Instructions (Signed)

## 2017-06-12 NOTE — Transfer of Care (Signed)
Immediate Anesthesia Transfer of Care Note  Patient: Clayton OsierMarvin Masullo  Procedure(s) Performed: Procedure(s): Bilateral microlumbar decompression L4-L5 and central laminectomy (Bilateral)  Patient Location: PACU  Anesthesia Type:General  Level of Consciousness:  sedated, patient cooperative and responds to stimulation  Airway & Oxygen Therapy:Patient Spontanous Breathing and Patient connected to face mask oxgen  Post-op Assessment:  Report given to PACU RN and Post -op Vital signs reviewed and stable  Post vital signs:  Reviewed and stable  Last Vitals:  Vitals:   06/12/17 0544 06/12/17 0950  BP: (!) 147/83 121/80  Pulse: 79 81  Resp: 16 (P) 19  Temp: 36.5 C (!) 36.4 C    Complications: No apparent anesthesia complications

## 2017-06-12 NOTE — Anesthesia Procedure Notes (Signed)
Procedure Name: Intubation Date/Time: 06/12/2017 7:32 AM Performed by: West Pugh Pre-anesthesia Checklist: Patient identified, Emergency Drugs available, Suction available, Patient being monitored and Timeout performed Patient Re-evaluated:Patient Re-evaluated prior to induction Oxygen Delivery Method: Circle system utilized Preoxygenation: Pre-oxygenation with 100% oxygen Induction Type: IV induction, Rapid sequence and Cricoid Pressure applied Laryngoscope Size: Mac and 4 Grade View: Grade III Tube type: Oral Tube size: 7.5 mm Number of attempts: 1 Airway Equipment and Method: Stylet Placement Confirmation: ETT inserted through vocal cords under direct vision,  positive ETCO2,  CO2 detector and breath sounds checked- equal and bilateral Secured at: 23 cm Tube secured with: Tape Dental Injury: Teeth and Oropharynx as per pre-operative assessment

## 2017-06-12 NOTE — H&P (View-Only) (Signed)
Clayton Kline is an 66 y.o. male.   Chief Complaint: back and leg pain HPI: The patient is a 66 year old male who presents for a Follow-up for Follow-up back. The patient is being followed for their back pain. They are now year(s) out from when symptoms began. Symptoms reported today include: pain and pain with standing. Current treatment includes: activity modification. The following medication has been used for pain control: none. The patient has reported improvement of their symptoms with: Cortisone injections. The patient indicates that they have questions or concerns today regarding surgery.  Eduardo Osier follows up and he is reporting still bilateral buttock pain, worse with activity, better with rest. He would like to discuss surgery. He is going to retire, go down to [facility].  He reports no back pain, mainly buttock pain.  Review of systems is negative for fevers, chest pain, shortness of breath, unexplained recent weight loss, loss of bowel or bladder function, burning with urination, joint swelling, rashes, weakness or numbness, difficulty with balance, easy bruising, excessive thirst or frequent urination.  Past Medical History:  Diagnosis Date  . Arthritis   . GERD (gastroesophageal reflux disease)   . Sleep apnea     Past Surgical History:  Procedure Laterality Date  . TONSILLECTOMY      No family history on file. Social History:  reports that he quit smoking about 35 years ago. He has never used smokeless tobacco. He reports that he drinks alcohol. He reports that he does not use drugs.  Allergies:  Allergies  Allergen Reactions  . Norco [Hydrocodone-Acetaminophen] Other (See Comments)    Can't concentrate very well     (Not in a hospital admission)  No results found for this or any previous visit (from the past 48 hour(s)). No results found.  Review of Systems  Constitutional: Negative.   HENT: Negative.   Eyes: Negative.   Respiratory: Negative.    Cardiovascular: Negative.   Gastrointestinal: Negative.   Genitourinary: Negative.   Musculoskeletal: Positive for back pain.  Skin: Negative.   Neurological: Positive for sensory change and focal weakness.  Psychiatric/Behavioral: Negative.     There were no vitals taken for this visit. Physical Exam  Constitutional: He is oriented to person, place, and time. He appears well-developed.  HENT:  Head: Normocephalic.  Eyes: Pupils are equal, round, and reactive to light.  Neck: Normal range of motion.  Cardiovascular: Normal rate.   Respiratory: Effort normal.  GI: Soft.  Musculoskeletal:  On exam, healthy, mild discomfort. Straight leg raises buttock pain bilaterally. Trace EHL bilaterally. Pain relieved with forward flexion. Radicular pain with extension.  Neurological: He is alert and oriented to person, place, and time.  Skin: Skin is warm and dry.    AP, lateral and flexion and extension demonstrates minimal offset at 4-5, but no instability in flexion and extension.  MRI demonstrates multifactorial severe stenosis at 4-5, slight offset is noted there.  Some hypertrophic ligamentum flavum is noted. The stenosis is predominantly beneath the inferior aspect of the neural arch at 4. There is some foraminal narrowing at L4 foramen, more on the right than on the left.  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 Again, no instability in flexion and extension. He has no motion at 4-5.  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 No instability in flexion and extension. No change from supine to the upright position. Myotomal weakness and dermal dysesthesias, also has facet arthropathy  at 3-4 and at 5-1.  1. We had an extensive discussion concerning current pathology, relevant anatomy and treatment options. Options living with his symptoms as they are now, avoiding extension and favoring flexion. We discussed  this. 2. I would get epidurals as well. 3. We discussed surgical intervention and decompression at 4-5. Given he is not moving and he does not have back pain, we could do a limited decompression without concomitant fusion, though I did indicate he may require a fusion in the future. He understands. 4. Bilateral decompression would be appropriate as well. There may be a pars defect at L5 on the left. He has facet arthropathy so any instrumentation at 4-5 would likely aggravate the facets at 3-4 and at 5-1. We will have him scheduled for that procedure. He is not allergic to penicillin. No history of DVT or MRSA. He is on a blood pressure medicine. He again predominately has it down in the left leg with standing and relieved with sitting, symptoms for four years.  Plan bilateral microlumbar decompression L4-5, possible central laminectomy  Dorothy SparkBISSELL, Allice Garro M., PA-C for Dr. Shelle IronBeane 06/08/2017, 12:14 PM

## 2017-06-12 NOTE — Anesthesia Postprocedure Evaluation (Signed)
Anesthesia Post Note  Patient: Clayton OsierMarvin Schroyer  Procedure(s) Performed: Procedure(s) (LRB): Bilateral microlumbar decompression L4-L5 and central laminectomy (Bilateral)     Patient location during evaluation: PACU Anesthesia Type: General Level of consciousness: awake Pain management: pain level controlled Vital Signs Assessment: post-procedure vital signs reviewed and stable Respiratory status: spontaneous breathing Cardiovascular status: stable Postop Assessment: no signs of nausea or vomiting Anesthetic complications: no    Last Vitals:  Vitals:   06/12/17 1015 06/12/17 1030  BP: 126/86 127/88  Pulse: 81 85  Resp: 16 17  Temp:  36.4 C    Last Pain:  Vitals:   06/12/17 1030  TempSrc:   PainSc: 3    Pain Goal: Patients Stated Pain Goal: 3 (06/12/17 0617)  LLE Motor Response: Purposeful movement (06/12/17 1030) LLE Sensation: No numbness;No tingling (06/12/17 1030) RLE Motor Response: Purposeful movement (06/12/17 1030) RLE Sensation: No numbness;No tingling (06/12/17 1030)      Chaye Misch JR,JOHN Autumn Pruitt

## 2017-06-12 NOTE — Op Note (Signed)
NAMEduardo Osier:  Mcconnell, Randy               ACCOUNT NO.:  [id number]  MEDICAL RECORD NO.:  [id number]  LOCATION:  PERIO                        [facility]  PHYSICIAN:  Jene EveryJeffrey Bradden Tadros, M.D.    DATE OF BIRTH:  07-14-1951  DATE OF PROCEDURE:  06/12/2017 DATE OF DISCHARGE:                              OPERATIVE REPORT   PREOPERATIVE DIAGNOSIS:  Spinal stenosis at L4-5.  POSTOPERATIVE DIAGNOSIS:  Spinal stenosis at L4-5.  PROCEDURE PERFORMED: 1. Bilateral hemilaminotomies and lumbar decompression at L4-5. 2. Bilateral L4 and L5 foraminotomies.  ANESTHESIA:  General.  ASSISTANT:  Lanna PocheJacqueline Bissell, PA.  HISTORY:  This is a 66 year old gentleman, pleasant with bilateral lower extremity radicular pain, L5 nerve root distribution, minimal-to-no back pain.  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  MRI indicating multifactorial severe stenosis at 4-5.  He had a slight offset at 4-5 and no instability in flexion and extension on his radiographs.  He had favorably-oriented facets.  We therefore discussed decompression at 4-5 bilaterally.  We felt to decompress his 5 radiculopathy without destabilizing at 4-5 to avoid need for concomitant fusion.  We discussed risks and benefits including bleeding, infection, damage to the neurovascular structures, no change in symptoms or worsening symptoms, DVT, PE, anesthetic complication, need for fusion in the future, etc.  TECHNIQUE:  With the patient in supine position after induction of adequate general anesthesia, 2 g of Kefzol, placed prone on the Andrews frame, all bony prominences were well padded.  Lumbar region was prepped and draped in usual sterile fashion.  Two 18-gauge spinal needles were utilized to localize the 4-5 interspace, confirmed with x-ray.  Incision was made from spinous process of 4-5.  Subcutaneous tissue was dissected.  Electrocautery was utilized to achieve hemostasis.  A  0.25% Marcaine with epinephrine was infiltrated in the perimuscular tissue. We divided the dorsolumbar fascia, in line with the skin incision. Paraspinous muscle was elevated from lamina of 4 and 5, McCullough retractor was placed, operating microscope draped and brought on the surgical field after confirmatory radiograph obtained.  Noted was very small interlaminar windows bilaterally felt to preserve the facets.  We performed hemilaminotomies bilaterally at L4, removed majority of the interspinous ligament, and formed hemilaminotomies of 4 bilaterally preserving the pars, but to the cephalad attachment of the ligamentum flavum.  In similar fashion, microcurette was utilized to detach the ligamentum flavum from the cephalad edge of 5 bilaterally.  The ligamentum flavum then was removed from the interspace centrally protecting the neural elements with a Woodson retractor.  We then began the meticulous process of decompressing the lateral recesses to the medial border of the pedicle bilaterally from both sides of the operating room table.  Utilizing a 2-mm Kerrison, there were adhesions noted to the thecal sac and we meticulously developed a plane between those adhesions and the lateral recess utilizing 2.0 microcurette and a Woodson retractor followed by removal of the ligamentum and undercutting the facet with a 2-mm Kerrison.  A very minimal portion of the medial aspect of the facet was resected, and we performed the foraminotomy bilaterally, had severe stenosis noted in the lateral recess bilaterally, multifactorial.  In a similar fashion,  we decompressed from the opposite side the lateral recess on the left to performing foraminotomy of 4 as well, and the 5. There were severe adhesions noted on the left into the foramen of 5. Then, we were able to develop a plane between the 5 root and the foramen.  A generous foraminotomy was performed in the cephalad edge of 5, it was partially  resected as well with a 2-mm Kerrison.  Following this, it was good.  A probe passed freely up the foramen of 4 and 5. There was no hypermobility noted at the motion segment.  Bipolar electrocautery was utilized to achieve hemostasis.  No evidence of CSF leakage or active bleeding was noted.  Confirmatory radiograph obtained. There was a thrombin-soaked Gelfoam placed in the laminotomy defect.  We felt we decompressed the thecal sac well.  Minimal blood loss.  We then closed the dorsolumbar fascia with #1 Vicryl and interrupted figure-of- eight sutures, subcu with 2-0 and skin with Prolene.  Sterile dressing applied, placed supine on the hospital bed, extubated without difficulty and transferred to the recovery room in satisfactory condition.  The patient tolerated the procedure well.  No complication.  Lanna Poche, [place].  Minimal blood loss.     Jene Every, M.D.     Cordelia Pen  D:  06/12/2017  T:  06/12/2017  Job:  [id number]

## 2017-06-12 NOTE — Interval H&P Note (Signed)
History and Physical Interval Note:  06/12/2017 7:11 AM  Clayton OsierMarvin Kline  has presented today for surgery, with the diagnosis of Stenosis L4-L5  The various methods of treatment have been discussed with the patient and family. After consideration of risks, benefits and other options for treatment, the patient has consented to  Procedure(s): Bilateral microlumbar decompression L4-L5, possible central laminectomy (N/A) as a surgical intervention .  The patient's history has been reviewed, patient examined, no change in status, stable for surgery.  I have reviewed the patient's chart and labs.  Questions were answered to the patient's satisfaction.     Aide Wojnar C

## 2017-06-13 DIAGNOSIS — M48062 Spinal stenosis, lumbar region with neurogenic claudication: Secondary | ICD-10-CM | POA: Diagnosis not present

## 2017-06-13 LAB — CBC
HEMATOCRIT: 39.5 % (ref 39.0–52.0)
HEMOGLOBIN: 13.5 g/dL (ref 13.0–17.0)
MCH: 30.4 pg (ref 26.0–34.0)
MCHC: 34.2 g/dL (ref 30.0–36.0)
MCV: 89 fL (ref 78.0–100.0)
Platelets: 241 10*3/uL (ref 150–400)
RBC: 4.44 MIL/uL (ref 4.22–5.81)
RDW: 12.9 % (ref 11.5–15.5)
WBC: 15.3 10*3/uL — ABNORMAL HIGH (ref 4.0–10.5)

## 2017-06-13 LAB — BASIC METABOLIC PANEL
ANION GAP: 10 (ref 5–15)
BUN: 14 mg/dL (ref 6–20)
CHLORIDE: 104 mmol/L (ref 101–111)
CO2: 22 mmol/L (ref 22–32)
Calcium: 8.5 mg/dL — ABNORMAL LOW (ref 8.9–10.3)
Creatinine, Ser: 1.01 mg/dL (ref 0.61–1.24)
GFR calc non Af Amer: >60 mL/min (ref >60)
GLUCOSE: 138 mg/dL — AB (ref 65–99)
POTASSIUM: 4.3 mmol/L (ref 3.5–5.1)
Sodium: 136 mmol/L (ref 135–145)

## 2017-06-13 NOTE — Progress Notes (Signed)
Physical Therapy Treatment Patient Details Name: Eduardo OsierMarvin Sao MRN: [id number] DOB: [birth year] Today's Date: 06/13/2017    History of Present Illness 66 yo male admitted for Bilateral microlumbar decompression L4-L5 and central laminectomy on 06/12/17.    PT Comments    The patient  Is  Ambulating slowly. Does not require a RW. Wife is also informative of post op mobility. No further PT needs.    Follow Up Recommendations  No PT follow up     Equipment Recommendations       Recommendations for Other Services       Precautions / Restrictions Precautions Precautions: Back Precaution Comments: Pt able to recall 3/3 precautions and maintain during functional activities Restrictions Weight Bearing Restrictions: No    Mobility  Bed Mobility Overal bed mobility: Needs Assistance Bed Mobility: Rolling;Sidelying to Sit Rolling: Supervision Sidelying to sit: Supervision       General bed mobility comments: Good technique, supervision for safety. HOB flat . demonstrated to  push into hands together to self assist sidelying to sitting.  Transfers Overall transfer level: Needs assistance Equipment used: Rolling walker (2 wheeled);None Transfers: Sit to/from Stand Sit to Stand: Supervision         General transfer comment: for safety, good hand placement and technique  Ambulation/Gait Ambulation/Gait assistance: Min assist Ambulation Distance (Feet): 250 Feet Assistive device: None Gait Pattern/deviations: Step-through pattern   Gait velocity interpretation: Below normal speed for age/gender General Gait Details: used RW x 20' then no AD. gait is slow and guarded but not balance loss.    Stairs Stairs: Yes   Stair Management: One rail Left;No rails;Forwards;Step to pattern Number of Stairs: 2 General stair comments: wife present to practice and HHA .   Wheelchair Mobility    Modified Rankin (Stroke Patients Only)       Balance                                             Cognition Arousal/Alertness: Awake/alert                                            Exercises      General Comments        Pertinent Vitals/Pain Pain Score: 4  Pain Location: right side of back Pain Descriptors / Indicators: Discomfort Pain Intervention(s): Premedicated before session;Monitored during session    Home Living                      Prior Function            PT Goals (current goals can now be found in the care plan section) Progress towards PT goals: Progressing toward goals    Frequency    Min 5X/week      PT Plan Current plan remains appropriate    Co-evaluation              AM-PAC PT "6 Clicks" Daily Activity  Outcome Measure  Difficulty turning over in bed (including adjusting bedclothes, sheets and blankets)?: Total Difficulty moving from lying on back to sitting on the side of the bed? : Total Difficulty sitting down on and standing up from a chair with arms (e.g., wheelchair, bedside commode, etc,.)?: Total Help needed moving to and  from a bed to chair (including a wheelchair)?: A Little Help needed walking in hospital room?: A Little Help needed climbing 3-5 steps with a railing? : A Lot 6 Click Score: 11    End of Session   Activity Tolerance: Patient tolerated treatment well Patient left: in chair;with call bell/phone within reach Nurse Communication: Mobility status PT Visit Diagnosis: Difficulty in walking, not elsewhere classified (R26.2);Pain     Time: 4098-1191 PT Time Calculation (min) (ACUTE ONLY): 31 min  Charges:  $Gait Training: 8-22 mins $Self Care/Home Management: 8-22                    G CodesBlanchard Kelch PT [phone]    Rada Hay 06/13/2017, 11:25 AM

## 2017-06-13 NOTE — Progress Notes (Signed)
Subjective: 1 Day Post-Op Procedure(s) (LRB): Bilateral microlumbar decompression L4-L5 and central laminectomy (Bilateral) Patient reports pain as 3 on 0-10 scale.    Objective: Vital signs in last 24 hours: Temp:  [97.5 F (36.4 C)-99 F (37.2 C)] 99 F (37.2 C) (07/21 0623) Pulse Rate:  [76-94] 81 (07/21 0623) Resp:  [16-20] 18 (07/21 0623) BP: (113-138)/(65-90) 120/65 (07/21 0623) SpO2:  [94 %-100 %] 94 % (07/21 0623)  Intake/Output from previous day: 07/20 0701 - 07/21 0700 In: 3420 [P.O.:1020; I.V.:2250; IV Piggyback:150] Out: 2100 [Urine:2050; Blood:50] Intake/Output this shift: No intake/output data recorded.   Recent Labs  06/13/17 0512  HGB 13.5    Recent Labs  06/13/17 0512  WBC 15.3*  RBC 4.44  HCT 39.5  PLT 241    Recent Labs  06/13/17 0512  NA 136  K 4.3  CL 104  CO2 22  BUN 14  CREATININE 1.01  GLUCOSE 138*  CALCIUM 8.5*   No results for input(s): LABPT, INR in the last 72 hours.  Neurologically intact Sensation intact distally Dorsiflexion/Plantar flexion intact Incision: dressing C/D/I  Assessment/Plan: 1 Day Post-Op Procedure(s) (LRB): Bilateral microlumbar decompression L4-L5 and central laminectomy (Bilateral) Advance diet Up with therapy D/C IV fluids Discharge home with home health  Instructions given  Yoshiye Kraft C 06/13/2017, 7:56 AM

## 2017-06-13 NOTE — Progress Notes (Signed)
Discharged from floor via w/c for transport home by car. Wife & belongings with pt. No changes in assessment. Clayton Kline   

## 2018-12-07 IMAGING — DX DG SPINE 1V PORT
1 series · 1 of 1 positions shown · non-contrast
Comparison: 06/12/2017

CLINICAL DATA: Intraoperative radiograph.

EXAM:
PORTABLE SPINE - 1 VIEW

[l-spine lat]
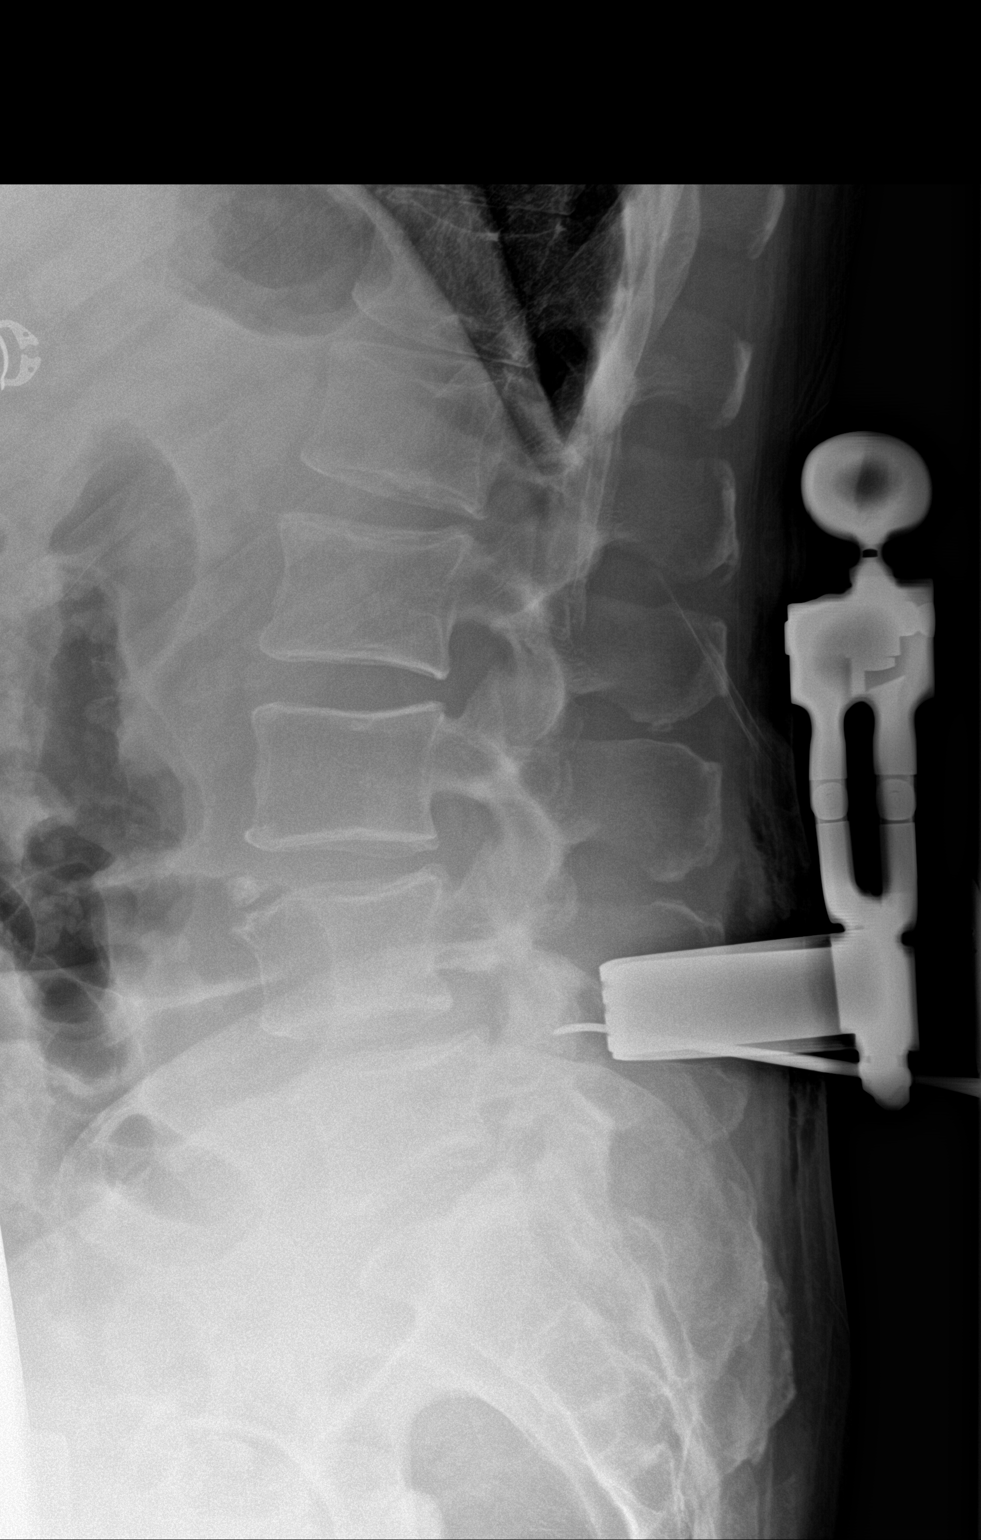

[1 of 1 positions shown; findings below may reference images not displayed]

FINDINGS: Single lateral radiograph of the lumbosacral spine demonstrates
surgical instrument at the level of L4-L5 posterior facets, in
keeping with preprocedural lumbar spine radiographs labeling.
Grossly unchanged 5 mm anterolisthesis of L4 on L5. Multilevel
degenerative disc disease.
IMPRESSION: Intraoperative radiograph with localization tool at the level of
L4-L5 posterior facets.
